# Patient Record
Sex: Male | Born: 1985 | Race: White | Hispanic: No | Smoking: Current every day smoker
Health system: Southern US, Community
[De-identification: ages and names within clinical notes are randomized; demographics above are authoritative.]

## PROBLEM LIST (undated history)

## (undated) DIAGNOSIS — R519 Headache, unspecified: Secondary | ICD-10-CM

## (undated) DIAGNOSIS — R51 Headache: Secondary | ICD-10-CM

---

## 2015-06-18 ENCOUNTER — Emergency Department (HOSPITAL_COMMUNITY): Payer: Self-pay

## 2015-06-18 ENCOUNTER — Encounter (HOSPITAL_COMMUNITY): Payer: Self-pay | Admitting: Emergency Medicine

## 2015-06-18 ENCOUNTER — Emergency Department (HOSPITAL_COMMUNITY)
Admission: EM | Admit: 2015-06-18 | Discharge: 2015-06-18 | Disposition: A | Discharge status: Home / Self Care | Payer: Self-pay | Attending: Emergency Medicine | Admitting: Emergency Medicine

## 2015-06-18 DIAGNOSIS — S93602A Unspecified sprain of left foot, initial encounter: Secondary | ICD-10-CM

## 2015-06-18 DIAGNOSIS — Y9289 Other specified places as the place of occurrence of the external cause: Secondary | ICD-10-CM | POA: Insufficient documentation

## 2015-06-18 DIAGNOSIS — S60221A Contusion of right hand, initial encounter: Secondary | ICD-10-CM | POA: Insufficient documentation

## 2015-06-18 DIAGNOSIS — Y9367 Activity, basketball: Secondary | ICD-10-CM | POA: Insufficient documentation

## 2015-06-18 DIAGNOSIS — S62394A Other fracture of fourth metacarpal bone, right hand, initial encounter for closed fracture: Secondary | ICD-10-CM | POA: Insufficient documentation

## 2015-06-18 DIAGNOSIS — S62309A Unspecified fracture of unspecified metacarpal bone, initial encounter for closed fracture: Secondary | ICD-10-CM

## 2015-06-18 DIAGNOSIS — Y998 Other external cause status: Secondary | ICD-10-CM | POA: Insufficient documentation

## 2015-06-18 DIAGNOSIS — W1839XA Other fall on same level, initial encounter: Secondary | ICD-10-CM | POA: Insufficient documentation

## 2015-06-18 MED ORDER — HYDROCODONE-ACETAMINOPHEN 5-325 MG PO TABS: 2.0000 | ORAL_TABLET | Freq: Four times a day (QID) | ORAL | Status: AC | PRN

## 2015-06-18 MED ORDER — IBUPROFEN 800 MG PO TABS: 800.0000 mg | ORAL_TABLET | Freq: Three times a day (TID) | ORAL | Status: AC

## 2015-06-18 MED ORDER — IBUPROFEN 800 MG PO TABS
800.0000 mg | ORAL_TABLET | Freq: Once | ORAL | Status: AC
  Administered 2015-06-18: 800 mg via ORAL
  Filled 2015-06-18: qty 1

## 2015-06-18 NOTE — ED Provider Notes (Signed)
History  By signing my name below, I, Karle Plumber, attest that this documentation has been prepared under the direction and in the presence of Fayrene Helper, PA-C. Electronically Signed: Karle Plumber, ED Scribe. 06/18/2015. 5:27 PM.  Chief Complaint  Patient presents with  . Ankle Pain  . Wrist Pain   The history is provided by the patient and medical records. No language interpreter was used.    HPI Comments:  Michael Black is a 30 y.o. male who presents to the Emergency Department complaining of a fall that occurred three days ago while playing basketball. Pt reports twisting the left ankle and injuring his right hand upon impact. He states he stepped on another player's foot, causing him to invert his left ankle. Pt reports associated swelling and bruising of the areas. This called his to fall on to the lateral right hand. He rates the pain at 9/10. Moving the areas increase the pain. Ice seems to help the hand pain but not the ankle pain. He has not taken anything for pain. He denies head trauma, LOC, fever, chills, nausea, vomiting, wounds, redness, numbness, tingling or weakness of the left ankle or foot or right hand. He is right hand dominant. 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 He denies allergies to any medications.  History reviewed. No pertinent past medical history. History reviewed. No pertinent past surgical history. History reviewed. No pertinent family history. Social History  Substance Use Topics  . Smoking status: None  . Smokeless tobacco: None  . Alcohol Use: None    Review of Systems  Constitutional: Negative for fever and chills.  Gastrointestinal: Negative for nausea and vomiting.  Musculoskeletal: Positive for joint swelling and arthralgias.  Skin: Positive for color change. Negative for wound.  Neurological: Negative for weakness and numbness.    Allergies  Review of patient's allergies indicates no known allergies.  Home Medications   Prior to Admission  medications   Not on File   Triage Vitals: BP 125/79 mmHg  Pulse 87  Temp(Src) 97.9 F (36.6 C) (Oral)  SpO2 96% Physical Exam  Constitutional: He is oriented to person, place, and time. He appears well-developed and well-nourished.  HENT:  Head: Normocephalic and atraumatic.  Eyes: EOM are normal.  Neck: Normal range of motion.  Cardiovascular: Normal rate.   Right arm radial pulse 2+ Cap refill less than 2 seconds of digits of right hand and left foot  Pulmonary/Chest: Effort normal.  Musculoskeletal: Normal range of motion. He exhibits tenderness.  No tenderness to palpation of right elbow. No tenderness to palpation of 4th and 5th metacarpal. Mild decreased ROM of 4th and 5th digits. Swelling to lateral dorsal aspect of right hand. No obvious deformity of right hand or left ankle or foot. Mild ecchymosis to hypothenar eminence. Left ankle with lateral malleolus, navicular bone and 5th MTP with tenderness to palpation.  Neurological: He is alert and oriented to person, place, and time.  Skin: Skin is warm and dry.  Psychiatric: He has a normal mood and affect. His behavior is normal.  Nursing note and vitals reviewed.   ED Course  Procedures (including critical care time) DIAGNOSTIC STUDIES: Oxygen Saturation is 96% on RA, adequate by my interpretation.   COORDINATION OF CARE: 5:23 PM- Will order crutches and splint right hand and left foot. Will refer to hand specialist. Will order Ibuprofen prior to discharge. Pt verbalizes understanding and agrees to plan.  Medications - No data to display  Labs Review Labs Reviewed - No data  to display  Imaging Review Dg Hand Complete Right  06/18/2015  CLINICAL DATA:  Larey SeatFell playing basketball 3 days ago, posterior RIGHT hand pain, initial encounter EXAM: RIGHT HAND - COMPLETE 3+ VIEW COMPARISON:  None FINDINGS: Osseous mineralization normal. Joint spaces preserved. Dorsal soft tissue swelling overlying the metacarpals with  additional soft tissue swelling along the ulnar border of the hand the level of the proximal fifth metacarpal. Nondisplaced oblique fracture base of fourth metacarpal. Tiny bony densities are seen dorsal to the [facility] on lateral view suspicious for fracture though uncertain if arising from the base of the fifth metacarpal or the adjacent hamate. No additional fracture, dislocation, or bone destruction. IMPRESSION: Nondisplaced oblique fracture at base of fourth metacarpal. Questionable fracture fragments at the dorsal margin of the fifth CMC joint, uncertain if related to the fifth metacarpal base or the adjacent hamate. Electronically Signed   By: Ulyses SouthwardMark  Boles M.D.   On: 06/18/2015 17:02   Dg Foot Complete Left  06/18/2015  CLINICAL DATA:  Larey SeatFell playing basketball 3 days ago, dorsal LEFT foot pain, initial encounter EXAM: LEFT FOOT - COMPLETE 3+ VIEW COMPARISON:  None FINDINGS: Osseous mineralization normal. Joint spaces preserved. Deformity at the head of the proximal phalanx fifth toe without definite additional fracture plane identified consistent with age-indeterminate fracture though favor this being old. No additional fracture, dislocation or bone destruction. IMPRESSION: Deformity at head of proximal phalanx fifth toe compatible with age-indeterminate fracture, favor old; recommend correlation for pain/ tenderness at this site to exclude acute injury. No additional fracture or dislocation identified. Electronically Signed   By: Ulyses SouthwardMark  Boles M.D.   On: 06/18/2015 17:04   I have personally reviewed and evaluated these images and lab results as part of my medical decision-making.   EKG Interpretation None      MDM   Final diagnoses:  Foot sprain, left, initial encounter  Closed fracture of metacarpal of right hand, initial encounter    BP 125/79 mmHg  Pulse 87  Temp(Src) 97.9 F (36.6 C) (Oral)  SpO2 96%   I personally performed the services described in this documentation, which was  scribed in my presence. The recorded information has been reviewed and is accurate.     5:40 PM Patient in with a mechanical injury when he injured his left foot and right hand. X-ray of the right hand demonstrate a nondisplaced oblique fracture at the base of the fourth metacarpal with a questionable fracture fragment at the dorsal margin of the fifth CMC joint. His pain is more appropriately presents at the fourth metacarpal. An order wrist splint was placed for protection and he will follow-up closely with enhancement specialist for further management. X-ray of his left foot demonstrate no acute fractures or dislocation. There is a deformity of the head of the proximal phalanx fifth toe compatible with age indeterminate fracture. He does not have any significant pain in that affected region on exam. No significant tenderness to his left ankle and left knee. An ASO will be applied. Crutch provided and outpatient follow-up given. Rice therapy discussed.  Fayrene HelperBowie Ayoub Arey, PA-C 06/18/15 [id number]  Lyndal Pulleyaniel Knott, MD 06/19/15 [phone]

## 2015-06-18 NOTE — ED Notes (Signed)
Twisted left ankle and popped right wrist in fall while playing basketball 3 days prior. Mild swelling to all areas

## 2015-06-18 NOTE — Discharge Instructions (Signed)
You have suffered a broken right hand and a sprain left foot.  Please follow up with hand specialist in 1 week for further management of your condition.  Take pain medication as prescribed.    Foot Sprain A foot sprain is an injury to one of the strong bands of tissue (ligaments) that connect and support the many bones in your feet. The ligament can be stretched too much or it can tear. A tear can be either partial or complete. The severity of the sprain depends on how much of the ligament was damaged or torn. CAUSES A foot sprain is usually caused by suddenly twisting or pivoting your foot. RISK FACTORS This injury is more likely to occur in people who:  Play a sport, such as basketball or football.  Exercise or play a sport without warming up.  Start a new workout or sport.  Suddenly increase how long or hard they exercise or play a sport. SYMPTOMS Symptoms of this condition start soon after an injury and include:  Pain, especially in the arch of the foot.  Bruising.  Swelling.  Inability to walk or use the foot to support body weight. DIAGNOSIS This condition is diagnosed with a medical history and physical exam. You may also have imaging tests, such as:  X-rays to make sure there are no broken bones (fractures).  MRI to see if the ligament has torn. TREATMENT Treatment varies depending on the severity of your sprain. Mild sprains can be treated with rest, ice, compression, and elevation (RICE). If your ligament is overstretched or partially torn, treatment usually involves keeping your foot in a fixed position (immobilization) for a period of time. To help you do this, your health care provider will apply a bandage, splint, or walking boot to keep your foot from moving until it heals. You may also be advised to use crutches or a scooter for a few weeks to avoid bearing weight on your foot while it is healing. If your ligament is fully torn, you may need surgery to reconnect the  ligament to the bone. After surgery, a cast or splint will be applied and will need to stay on your foot while it heals. Your health care provider may also suggest exercises or physical therapy to strengthen your foot. HOME CARE INSTRUCTIONS If You Have a Bandage, Splint, or Walking Boot:  Wear it as directed by your health care provider. Remove it only as directed by your health care provider.  Loosen the bandage, splint, or walking boot if your toes become numb and tingle, or if they turn cold and blue. Bathing  If your health care provider approves bathing and showering, cover the bandage or splint with a watertight plastic bag to protect it from water. Do not let the bandage or splint get wet. Managing Pain, Stiffness, and Swelling   If directed, apply ice to the injured area:  Put ice in a plastic bag.  Place a towel between your skin and the bag.  Leave the ice on for 20 minutes, 2-3 times per day.  Move your toes often to avoid stiffness and to lessen swelling.  Raise (elevate) the injured area above the level of your heart while you are sitting or lying down. Driving  Do not drive or operate heavy machinery while taking pain medicine.  Do not drive while wearing a bandage, splint, or walking boot on a foot that you use for driving. Activity  Rest as directed by your health care provider.  Do  not use the injured foot to support your body weight until your health care provider says that you can. Use crutches or other supportive devices as directed by your health care provider.  Ask your health care provider what activities are safe for you. Gradually increase how much and how far you walk until your health care provider says it is safe to return to full activity.  Do any exercise or physical therapy as directed by your health care provider. General Instructions  If a splint was applied, do not put pressure on any part of it until it is fully hardened. This may take  several hours.  Take medicines only as directed by your health care provider. These include over-the-counter medicines and prescription medicines.  Keep all follow-up visits as directed by your health care provider. This is important.  When you can walk without pain, wear supportive shoes that have stiff soles. Do not wear flip-flops, and do not walk barefoot. SEEK MEDICAL CARE IF:  Your pain is not controlled with medicine.  Your bruising or swelling gets worse or does not get better with treatment.  Your splint or walking boot is damaged. SEEK IMMEDIATE MEDICAL CARE IF:  Your foot is numb or blue.  Your foot feels colder than normal.   This information is not intended to replace advice given to you by your health care provider. Make sure you discuss any questions you have with your health care provider.   Document Released: 08/06/2001 Document Revised: 07/01/2014 Document Reviewed: 12/18/2013 Elsevier Interactive Patient Education 2016 [employer]  Metacarpal Fracture A metacarpal fracture is a break (fracture) of a bone in the hand. Metacarpals are the bones that extend from your knuckles to your wrist. In each hand, you have five metacarpal bones that connect your fingers and your thumb to your wrist. Some hand fractures have bone pieces that are close together and stable (simple). These fractures may be treated with only a splint or cast. Hand fractures that have many pieces of broken bone (comminuted), unstable bone pieces (displaced), or a bone that breaks through the skin (compound) usually require surgery. CAUSES This injury may be caused by:  A fall.  A hard, direct hit to your hand.  An injury that squeezes your knuckle, stretches your finger out of place, or crushes your hand. RISK FACTORS This injury is more likely to occur if:  You play contact sports.  You have certain bone diseases. SYMPTOMS  Symptoms of this type of fracture develop soon after the  injury. Symptoms may include:  Swelling.  Pain.  Stiffness.  Increased pain with movement.  Bruising.  Inability to move a finger.  A shortened finger.  A finger knuckle that looks sunken in.  Unusual appearance of the hand or finger (deformity). DIAGNOSIS  This injury may be diagnosed based on your signs and symptoms, especially if you had a recent hand injury. Your health care provider will perform a physical exam. He or she may also order X-rays to confirm the diagnosis.  TREATMENT  Treatment for this injury depends on the type of fracture you have and how severe it is. Possible treatments include:  Non-reduction. This can be done if the bone does not need to be moved back into place. The fracture can be casted or splinted as it is.   Closed reduction. If your bone is stable and can be moved back into place, you may only need to wear a cast or splint or have buddy taping.  Closed  reduction with internal fixation (CRIF). This is the most common treatment. You may have this procedure if your bone can be moved back into place but needs more support. Wires, pins, or screws may be inserted through your skin to stabilize the fracture.  Open reduction with internal fixation (ORIF). This may be needed if your fracture is severe and unstable. It involves surgery to move your bone back into the right position. Screws, wires, or plates are used to stabilize the fracture. After all procedures, you may need to wear a cast or a splint for several weeks. You will also need to have follow-up X-rays to make sure that the bone is healing well and staying in position. After you no longer need your cast or splint, you may need physical therapy. This will help you to regain full movement and strength in your hand.  HOME CARE INSTRUCTIONS  If You Have a Cast:  Do not stick anything inside the cast to scratch your skin. Doing that increases your risk of infection.  Check the skin around the cast  every day. Report any concerns to your health care provider. You may put lotion on dry skin around the edges of the cast. Do not apply lotion to the skin underneath the cast. If You Have a Splint:  Wear it as directed by your health care provider. Remove it only as directed by your health care provider.  Loosen the splint if your fingers become numb and tingle, or if they turn cold and blue. Bathing  Cover the cast or splint with a watertight plastic bag to protect it from water while you take a bath or a shower. Do not let the cast or splint get wet. Managing Pain, Stiffness, and Swelling  If directed, apply ice to the injured area (if you have a splint, not a cast):  Put ice in a plastic bag.  Place a towel between your skin and the bag.  Leave the ice on for 20 minutes, 2-3 times a day.  Move your fingers often to avoid stiffness and to lessen swelling.  Raise the injured area above the level of your heart while you are sitting or lying down. Driving  Do not drive or operate heavy machinery while taking pain medicine.  Do not drive while wearing a cast or splint on a hand that you use for driving. Activity  Return to your normal activities as directed by your health care provider. Ask your health care provider what activities are safe for you. General Instructions  Do not put pressure on any part of the cast or splint until it is fully hardened. This may take several hours.  Keep the cast or splint clean and dry.  Do not use any tobacco products, including cigarettes, chewing tobacco, or electronic cigarettes. Tobacco can delay bone healing. If you need help quitting, ask your health care provider.  Take medicines only as directed by your health care provider.  Keep all follow-up visits as directed by your health care provider. This is important. SEEK MEDICAL CARE IF:   Your pain is getting worse.  You have redness, swelling, or pain in the injured area.   You have  fluid, blood, or pus coming from under your cast or splint.   You notice a bad smell coming from under your cast or splint.   You have a fever.  SEEK IMMEDIATE MEDICAL CARE IF:   You develop a rash.   You have trouble breathing.   Your skin  or nails on your injured hand turn blue or gray even after you loosen your splint.  Your injured hand feels cold or becomes numb even after you loosen your splint.   You develop severe pain under the cast or in your hand.   This information is not intended to replace advice given to you by your health care provider. Make sure you discuss any questions you have with your health care provider.   Document Released: 02/14/2005 Document Revised: 11/05/2014 Document Reviewed: 12/04/2013 Elsevier Interactive Patient Education [employer]

## 2016-04-27 ENCOUNTER — Encounter (HOSPITAL_COMMUNITY): Payer: Self-pay | Admitting: *Deleted

## 2016-04-27 ENCOUNTER — Emergency Department (HOSPITAL_COMMUNITY)
Admission: EM | Admit: 2016-04-27 | Discharge: 2016-04-27 | Discharge status: Against Medical Advice | Payer: Self-pay | Attending: Emergency Medicine | Admitting: Emergency Medicine

## 2016-04-27 ENCOUNTER — Emergency Department (HOSPITAL_COMMUNITY): Payer: Self-pay

## 2016-04-27 DIAGNOSIS — S62366A Nondisplaced fracture of neck of fifth metacarpal bone, right hand, initial encounter for closed fracture: Secondary | ICD-10-CM | POA: Insufficient documentation

## 2016-04-27 DIAGNOSIS — S62346A Nondisplaced fracture of base of fifth metacarpal bone, right hand, initial encounter for closed fracture: Secondary | ICD-10-CM

## 2016-04-27 DIAGNOSIS — Y9389 Activity, other specified: Secondary | ICD-10-CM | POA: Insufficient documentation

## 2016-04-27 DIAGNOSIS — Y929 Unspecified place or not applicable: Secondary | ICD-10-CM | POA: Insufficient documentation

## 2016-04-27 DIAGNOSIS — Y999 Unspecified external cause status: Secondary | ICD-10-CM | POA: Insufficient documentation

## 2016-04-27 DIAGNOSIS — W2201XA Walked into wall, initial encounter: Secondary | ICD-10-CM | POA: Insufficient documentation

## 2016-04-27 DIAGNOSIS — F17210 Nicotine dependence, cigarettes, uncomplicated: Secondary | ICD-10-CM | POA: Insufficient documentation
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
## 2016-04-27 NOTE — ED Provider Notes (Signed)
[facility]PT Provider Note   CSN: [id number] Arrival date & time: 04/27/16  1659   By signing my name below, I, Teofilo Pod, attest that this documentation has been prepared under the direction and in the presence of Mathews Robinsons, [place]. Electronically Signed: Teofilo Pod, ED Scribe. 04/27/2016. 7:18 PM.   History   Chief Complaint Chief Complaint  Patient presents with  . Hand Injury    The history is provided by the patient. No language interpreter was used.   HPI Comments:  Michael Black is a 31 y.o. male who presents to the Emergency Department complaining of constant right hand pain/swelling following an injury 9 days ago. Pt states that when he was intoxicated 9 days ago he punched a wall. He reports constant pain and swelling since the injury. Pt states that he was having difficulty using his right hand at work, and notes that he was sent here by his employer for legal purposes. He has used ice with no relief for pain/swelling. Pt denies numbness/tingling in his fingers.  History reviewed. No pertinent past medical history.  There are no active problems to display for this patient.   History reviewed. No pertinent surgical history.     Home Medications    Prior to Admission medications   Medication Sig Start Date End Date Taking? Authorizing Provider  HYDROcodone-acetaminophen (NORCO/VICODIN) 5-325 MG tablet Take 2 tablets by mouth every 6 (six) hours as needed for severe pain. 06/18/15   Fayrene Helper, PA-C  ibuprofen (ADVIL,MOTRIN) 800 MG tablet Take 1 tablet (800 mg total) by mouth 3 (three) times daily. 06/18/15   Fayrene Helper, PA-C    Family History No family history on file.  Social History Social History  Substance Use Topics  . Smoking status: Current Every Day Smoker    Packs/day: 0.50    Types: Cigarettes  . Smokeless tobacco: Never Used  . Alcohol use Yes     Comment: fifth whisky every other day     Allergies   Patient has no  known allergies.   Review of Systems Review of Systems  Constitutional: Negative for chills.  HENT: Negative for ear pain and sore throat.   Eyes: Negative for pain and visual disturbance.  Respiratory: Negative for cough and shortness of breath.   Cardiovascular: Negative for chest pain and palpitations.  Gastrointestinal: Negative for abdominal pain and vomiting.  Genitourinary: Negative for dysuria and hematuria.  Musculoskeletal: Positive for arthralgias and joint swelling. Negative for back pain.  Skin: Negative for color change and rash.  Neurological: Negative for seizures, syncope and numbness.     Physical Exam Updated Vital Signs BP 118/70 (BP Location: Left Arm)   Pulse 99   Temp 98.1 F (36.7 C) (Oral)   Resp 18   Ht 5\' 8"  (1.727 m)   Wt 86.2 kg   SpO2 100%   BMI 28.89 kg/m   Physical Exam  Constitutional: He appears well-developed and well-nourished. No distress.  Patient is afebrile, non-toxic appearing, seating comfortably in chair in no acute distress.  HENT:  Head: Normocephalic and atraumatic.  Eyes: Conjunctivae are normal.  Cardiovascular: Normal rate.   Pulmonary/Chest: Effort normal.  Abdominal: He exhibits no distension.  Musculoskeletal:  Full ROM of right fingers. Tender over the proximal 5th metacarpal.  Neurological: He is alert.  Skin: Skin is warm and dry. Capillary refill takes less than 2 seconds.  Psychiatric: He has a normal mood and affect.  Nursing note and vitals reviewed.  ED Treatments / Results  DIAGNOSTIC STUDIES:  Oxygen Saturation is 97% on RA, normal by my interpretation.    COORDINATION OF CARE:  7:17 PM Pt was offered a new immobilizer but he refused. Pt states that he only needs documentation that he was seen.   Labs (all labs ordered are listed, but only abnormal results are displayed) Labs Reviewed - No data to display  EKG  EKG Interpretation None       Radiology Dg Hand Complete Right  Result  Date: 04/27/2016 CLINICAL DATA:  31 y/o M; hand injury with pain greatest of fifth metacarpal EXAM: RIGHT HAND - COMPLETE 3+ VIEW COMPARISON:  None. FINDINGS: Lucency of base of fifth metacarpal medially, question nondisplaced fracture. No other fracture or dislocation identified. IMPRESSION: Lucency of base of fifth metacarpal medially, question nondisplaced fracture, correlate for focal tenderness. No other fracture or dislocation identified. Electronically Signed   By: Mitzi HansenLance  Furusawa-Stratton M.D.   On: 04/27/2016 17:32    Procedures Procedures (including critical care time)  Medications Ordered in ED Medications - No data to display   Initial Impression / Assessment and Plan / ED Course  I have reviewed the triage vital signs and the nursing notes.  Pertinent labs & imaging results that were available during my care of the patient were reviewed by me and considered in my medical decision making (see chart for details).  Discussed strict return precautions. Patient was advised to return to the emergency department if experiencing any new or worsening symptoms. Patient clearly understood instructions and agreed with discharge plan.     During otherwise healthy 31 year old male presenting 90s after punching a wall with his right hand. He has had tenderness and swelling over the fifth metacarpal. On exam there is mild swelling and tenderness over the proximal fifth metacarpal. X-ray with questionable nondisplaced fracture of fifth metacarpal.  Advised patient that he needed to have his hand immobilized and when letting them know that I would order a splint, they stated that he already had one from a prior boxer's fracture injury and he did not have the time to wait to have a splint put on. Patient and girlfriend state that they need an note to say that they were here so he can go back to work but refuses any treatment. Patient left AMA Discussed the risk of leaving without immobilizing the  injury and patient stated that he will use to when he has at home.  We discussed the nature and purpose, risks and benefits, as well as, the alternatives of treatment. Time was given to allow the opportunity to ask questions and consider their options, and after the discussion, the patient decided to refuse the offerred treatment. The patient was informed that refusal could lead to, but was not limited to, permanent disability, or severe pain. If present, I asked the relatives or significant others to dissuade them without success.  Prior to refusing, I determined that the patient had the capacity to make their decision and understood the consequences of that decision. After refusal, I made every reasonable opportunity to treat them to the best of my ability.  The patient was notified that they may return to the emergency department at any time for further treatment.     Final Clinical Impressions(s) / ED Diagnoses   Final diagnoses:  Closed nondisplaced fracture of base of fifth metacarpal bone of right hand, initial encounter    New Prescriptions Discharge Medication List as of 04/27/2016  7:28 PM  I personally performed the services described in this documentation, which was scribed in my presence. The recorded information has been reviewed and is accurate.       Georgiana Shore, PA-C 04/27/16 1951    Gerhard Munch, MD 04/27/16 2041

## 2016-09-22 ENCOUNTER — Encounter (HOSPITAL_COMMUNITY): Payer: Self-pay | Admitting: Emergency Medicine

## 2016-09-22 ENCOUNTER — Ambulatory Visit (HOSPITAL_COMMUNITY)
Admission: EM | Admit: 2016-09-22 | Discharge: 2016-09-23 | Disposition: A | Discharge status: Home / Self Care | Payer: Self-pay | Attending: Emergency Medicine | Admitting: Emergency Medicine

## 2016-09-22 DIAGNOSIS — L02512 Cutaneous abscess of left hand: Secondary | ICD-10-CM

## 2016-09-22 DIAGNOSIS — F17210 Nicotine dependence, cigarettes, uncomplicated: Secondary | ICD-10-CM | POA: Insufficient documentation

## 2016-09-22 DIAGNOSIS — R197 Diarrhea, unspecified: Secondary | ICD-10-CM | POA: Insufficient documentation

## 2016-09-22 DIAGNOSIS — R51 Headache: Secondary | ICD-10-CM | POA: Insufficient documentation

## 2016-09-22 DIAGNOSIS — T1490XA Injury, unspecified, initial encounter: Secondary | ICD-10-CM

## 2016-09-22 HISTORY — DX: Headache: R51

## 2016-09-22 HISTORY — DX: Headache, unspecified: R51.9

## 2016-09-22 MED ORDER — OXYCODONE-ACETAMINOPHEN 5-325 MG PO TABS
1.0000 | ORAL_TABLET | ORAL | Status: DC | PRN
  Administered 2016-09-22: 1 via ORAL

## 2016-09-22 MED ORDER — OXYCODONE-ACETAMINOPHEN 5-325 MG PO TABS
ORAL_TABLET | ORAL | Status: AC
  Filled 2016-09-22: qty 1

## 2016-09-22 NOTE — ED Triage Notes (Signed)
Pt presents with wound to R ring finger for 5 days; area is red, swollen and warm to touch; now spreading to knuckles and hand; pt reports very painful; did drain a coffee colored ooze and then white colored; pt also reports some cold chills

## 2016-09-22 NOTE — ED Provider Notes (Signed)
[facility] Provider Note   CSN: [id number] Arrival date & time: 09/22/16  1924     History   Chief Complaint Chief Complaint  Patient presents with  . Abscess    HPI Michael Black is a 31 y.o. male.  The history is provided by the patient. No language interpreter was used.  Abscess  Location:  Hand Hand abscess location:  L fingers Size:  2 Abscess quality: painful and redness   Red streaking: no   Duration:  2 days Progression:  Worsening Pain details:    Quality:  Aching and pressure   Severity:  Moderate   Duration:  2 days   Timing:  Constant   Progression:  Worsening Chronicity:  New Context: not diabetes   Relieved by:  Nothing Worsened by:  Nothing Ineffective treatments:  None tried Associated symptoms: no fever   Pt complains of pain to left ring finger.  Pt thinks something bit him.  Pt complains of swelling to finger.  Pt reports now swelling to hand.  Pt can not move finger.    Past Medical History:  Diagnosis Date  . Headache     There are no active problems to display for this patient.   No past surgical history on file.     Home Medications    Prior to Admission medications   Medication Sig Start Date End Date Taking? Authorizing Provider  HYDROcodone-acetaminophen (NORCO/VICODIN) 5-325 MG tablet Take 2 tablets by mouth every 6 (six) hours as needed for severe pain. 06/18/15   Fayrene Helperran, [place], PA-C  ibuprofen (ADVIL,MOTRIN) 800 MG tablet Take 1 tablet (800 mg total) by mouth 3 (three) times daily. 06/18/15   Fayrene Helperran, [place], PA-C    Family History No family history on file.  Social History Social History  Substance Use Topics  . Smoking status: Current Every Day Smoker    Packs/day: 0.50    Types: Cigarettes  . Smokeless tobacco: Never Used  . Alcohol use Yes     Comment: fifth whisky every other day     Allergies   Patient has no known allergies.   Review of Systems Review of Systems  Constitutional: Negative for fever.    All other systems reviewed and are negative.    Physical Exam Updated Vital Signs BP (!) 124/95   Pulse 86   Temp 97.9 F (36.6 C) (Oral)   Resp (!) 22   Ht 5\' 8"  (1.727 m)   Wt 88.5 kg (195 lb)   SpO2 98%   BMI 29.65 kg/m   Physical Exam  Constitutional: He appears well-developed and well-nourished.  HENT:  Head: Normocephalic.  Musculoskeletal: He exhibits tenderness.  Swollen left ring finger. Decreased range of motion  Neurological: He is alert.  Skin: Skin is warm.  Psychiatric: He has a normal mood and affect.  Nursing note and vitals reviewed.    ED Treatments / Results  Labs (all labs ordered are listed, but only abnormal results are displayed) Labs Reviewed - No data to display  EKG  EKG Interpretation None       Radiology No results found.  Procedures Procedures (including critical care time)  Medications Ordered in ED Medications  oxyCODONE-acetaminophen (PERCOCET/ROXICET) 5-325 MG per tablet 1 tablet (1 tablet Oral Given 09/22/16 1939)  oxyCODONE-acetaminophen (PERCOCET/ROXICET) 5-325 MG per tablet (not administered)     Initial Impression / Assessment and Plan / ED Course  I have reviewed the triage vital signs and the nursing notes.  Pertinent labs & imaging  results that were available during my care of the patient were reviewed by me and considered in my medical decision making (see chart for details).     Tetanus 1 year ago.  Final Clinical Impressions(s) / ED Diagnoses   Final diagnoses:  Injury  Abscess of left ring finger    New Prescriptions New Prescriptions   No medications on file   I spoke to Dr. Merlyn Lotkuzma who will see pt here.  Labs and xray ordered.  Pt requested to stay npo   Osie CheeksSofia, Leslie K, PA-C 09/22/16 2310    Rolland PorterJames, Mark, MD 10/06/16 [phone]

## 2016-09-23 ENCOUNTER — Emergency Department (HOSPITAL_COMMUNITY): Payer: Self-pay | Admitting: Anesthesiology

## 2016-09-23 ENCOUNTER — Encounter (HOSPITAL_COMMUNITY)
Admission: EM | Disposition: A | Discharge status: Home / Self Care | Payer: Self-pay | Source: Home / Self Care | Attending: Emergency Medicine

## 2016-09-23 ENCOUNTER — Emergency Department (HOSPITAL_COMMUNITY): Payer: Self-pay

## 2016-09-23 ENCOUNTER — Encounter (HOSPITAL_COMMUNITY): Payer: Self-pay | Admitting: Anesthesiology

## 2016-09-23 HISTORY — PX: INCISION AND DRAINAGE ABSCESS: SHX5864

## 2016-09-23 LAB — CBC WITH DIFFERENTIAL/PLATELET
Basophils Absolute: 0 10*3/uL (ref 0.0–0.1)
Basophils Relative: 0 %
EOS PCT: 2 %
Eosinophils Absolute: 0.2 10*3/uL (ref 0.0–0.7)
HCT: 46.3 % (ref 39.0–52.0)
HEMOGLOBIN: 15.7 g/dL (ref 13.0–17.0)
LYMPHS ABS: 2.9 10*3/uL (ref 0.7–4.0)
LYMPHS PCT: 35 %
MCH: 31.8 pg (ref 26.0–34.0)
MCHC: 33.9 g/dL (ref 30.0–36.0)
MCV: 93.7 fL (ref 78.0–100.0)
Monocytes Absolute: 0.6 10*3/uL (ref 0.1–1.0)
Monocytes Relative: 7 %
NEUTROS PCT: 56 %
Neutro Abs: 4.7 10*3/uL (ref 1.7–7.7)
Platelets: 285 10*3/uL (ref 150–400)
RBC: 4.94 MIL/uL (ref 4.22–5.81)
RDW: 12.9 % (ref 11.5–15.5)
WBC: 8.5 10*3/uL (ref 4.0–10.5)

## 2016-09-23 LAB — BASIC METABOLIC PANEL
Anion gap: 9 (ref 5–15)
BUN: 9 mg/dL (ref 6–20)
CHLORIDE: 100 mmol/L — AB (ref 101–111)
CO2: 27 mmol/L (ref 22–32)
Calcium: 9.8 mg/dL (ref 8.9–10.3)
Creatinine, Ser: 0.88 mg/dL (ref 0.61–1.24)
GFR calc Af Amer: >60 mL/min (ref >60)
GFR calc non Af Amer: >60 mL/min (ref >60)
GLUCOSE: 87 mg/dL (ref 65–99)
POTASSIUM: 4.1 mmol/L (ref 3.5–5.1)
Sodium: 136 mmol/L (ref 135–145)

## 2016-09-23 SURGERY — INCISION AND DRAINAGE, ABSCESS
Anesthesia: General | Laterality: Right

## 2016-09-23 MED ORDER — OXYCODONE HCL 5 MG/5ML PO SOLN: 5.0000 mg | Freq: Once | ORAL | Status: DC | PRN

## 2016-09-23 MED ORDER — MIDAZOLAM HCL 2 MG/2ML IJ SOLN
INTRAMUSCULAR | Status: AC
  Filled 2016-09-23: qty 2

## 2016-09-23 MED ORDER — FENTANYL CITRATE (PF) 250 MCG/5ML IJ SOLN
INTRAMUSCULAR | Status: DC | PRN
  Administered 2016-09-23 (×2): 50 ug via INTRAVENOUS

## 2016-09-23 MED ORDER — HYDROMORPHONE HCL 1 MG/ML IJ SOLN
1.0000 mg | Freq: Once | INTRAMUSCULAR | Status: AC
  Administered 2016-09-23: 1 mg via INTRAVENOUS
  Filled 2016-09-23: qty 1

## 2016-09-23 MED ORDER — SULFAMETHOXAZOLE-TRIMETHOPRIM 800-160 MG PO TABS: 1.0000 | ORAL_TABLET | Freq: Two times a day (BID) | ORAL | 0 refills | Status: AC

## 2016-09-23 MED ORDER — LIDOCAINE HCL (CARDIAC) 20 MG/ML IV SOLN
INTRAVENOUS | Status: DC | PRN
  Administered 2016-09-23: 80 mg via INTRATRACHEAL

## 2016-09-23 MED ORDER — LACTATED RINGERS IV SOLN
INTRAVENOUS | Status: DC | PRN
  Administered 2016-09-23: 02:00:00 via INTRAVENOUS

## 2016-09-23 MED ORDER — VANCOMYCIN HCL 1000 MG IV SOLR
INTRAVENOUS | Status: DC | PRN
  Administered 2016-09-23: 1000 mg via INTRAVENOUS

## 2016-09-23 MED ORDER — BUPIVACAINE HCL (PF) 0.25 % IJ SOLN
INTRAMUSCULAR | Status: AC
  Filled 2016-09-23: qty 30

## 2016-09-23 MED ORDER — ONDANSETRON HCL 4 MG/2ML IJ SOLN
INTRAMUSCULAR | Status: DC | PRN
  Administered 2016-09-23: 4 mg via INTRAVENOUS

## 2016-09-23 MED ORDER — MIDAZOLAM HCL 5 MG/5ML IJ SOLN
INTRAMUSCULAR | Status: DC | PRN
  Administered 2016-09-23: 2 mg via INTRAVENOUS

## 2016-09-23 MED ORDER — OXYCODONE HCL 5 MG PO TABS: 5.0000 mg | ORAL_TABLET | Freq: Once | ORAL | Status: DC | PRN

## 2016-09-23 MED ORDER — HYDROCODONE-ACETAMINOPHEN 5-325 MG PO TABS: ORAL_TABLET | ORAL | 0 refills | Status: AC

## 2016-09-23 MED ORDER — HYDROMORPHONE HCL 1 MG/ML IJ SOLN: 0.2500 mg | INTRAMUSCULAR | Status: DC | PRN

## 2016-09-23 MED ORDER — VANCOMYCIN HCL IN DEXTROSE 1-5 GM/200ML-% IV SOLN
INTRAVENOUS | Status: AC
  Filled 2016-09-23: qty 200

## 2016-09-23 MED ORDER — FENTANYL CITRATE (PF) 250 MCG/5ML IJ SOLN
INTRAMUSCULAR | Status: AC
  Filled 2016-09-23: qty 5

## 2016-09-23 MED ORDER — BUPIVACAINE HCL (PF) 0.25 % IJ SOLN
INTRAMUSCULAR | Status: DC | PRN
  Administered 2016-09-23: 10 mL

## 2016-09-23 MED ORDER — PROPOFOL 10 MG/ML IV BOLUS
INTRAVENOUS | Status: DC | PRN
  Administered 2016-09-23: 200 mg via INTRAVENOUS

## 2016-09-23 SURGICAL SUPPLY — 47 items
BANDAGE ACE 3X5.8 VEL STRL LF (GAUZE/BANDAGES/DRESSINGS) ×3 IMPLANT
BANDAGE ACE 4X5 VEL STRL LF (GAUZE/BANDAGES/DRESSINGS) ×3 IMPLANT
BANDAGE COBAN STERILE 2 (GAUZE/BANDAGES/DRESSINGS) IMPLANT
BNDG COHESIVE 1X5 TAN STRL LF (GAUZE/BANDAGES/DRESSINGS) ×3 IMPLANT
BNDG CONFORM 2 STRL LF (GAUZE/BANDAGES/DRESSINGS) ×3 IMPLANT
BNDG ESMARK 4X9 LF (GAUZE/BANDAGES/DRESSINGS) IMPLANT
BNDG GAUZE ELAST 4 BULKY (GAUZE/BANDAGES/DRESSINGS) ×3 IMPLANT
CORDS BIPOLAR (ELECTRODE) ×3 IMPLANT
COVER SURGICAL LIGHT HANDLE (MISCELLANEOUS) ×3 IMPLANT
DECANTER SPIKE VIAL GLASS SM (MISCELLANEOUS) ×3 IMPLANT
DRAIN PENROSE 1/4X12 LTX STRL (WOUND CARE) IMPLANT
DRSG ADAPTIC 3X8 NADH LF (GAUZE/BANDAGES/DRESSINGS) IMPLANT
DRSG EMULSION OIL 3X3 NADH (GAUZE/BANDAGES/DRESSINGS) IMPLANT
DRSG PAD ABDOMINAL 8X10 ST (GAUZE/BANDAGES/DRESSINGS) ×6 IMPLANT
GAUZE PACKING IODOFORM 1/4X15 (GAUZE/BANDAGES/DRESSINGS) ×3 IMPLANT
GAUZE SPONGE 4X4 12PLY STRL (GAUZE/BANDAGES/DRESSINGS) ×3 IMPLANT
GAUZE XEROFORM 1X8 LF (GAUZE/BANDAGES/DRESSINGS) ×3 IMPLANT
GLOVE BIO SURGEON STRL SZ7.5 (GLOVE) ×3 IMPLANT
GLOVE BIOGEL PI IND STRL 8 (GLOVE) ×1 IMPLANT
GLOVE BIOGEL PI INDICATOR 8 (GLOVE) ×2
GOWN STRL REUS W/ TWL LRG LVL3 (GOWN DISPOSABLE) ×1 IMPLANT
GOWN STRL REUS W/TWL LRG LVL3 (GOWN DISPOSABLE) ×2
KIT BASIN OR (CUSTOM PROCEDURE TRAY) ×3 IMPLANT
KIT ROOM TURNOVER OR (KITS) ×3 IMPLANT
LOOP VESSEL MAXI BLUE (MISCELLANEOUS) IMPLANT
MANIFOLD NEPTUNE II (INSTRUMENTS) ×3 IMPLANT
NEEDLE HYPO 25X1 1.5 SAFETY (NEEDLE) IMPLANT
NS IRRIG 1000ML POUR BTL (IV SOLUTION) ×3 IMPLANT
PACK ORTHO EXTREMITY (CUSTOM PROCEDURE TRAY) ×3 IMPLANT
PAD ARMBOARD 7.5X6 YLW CONV (MISCELLANEOUS) ×6 IMPLANT
SCRUB BETADINE 4OZ XXX (MISCELLANEOUS) ×3 IMPLANT
SET CYSTO W/LG BORE CLAMP LF (SET/KITS/TRAYS/PACK) ×3 IMPLANT
SOL PREP POV-IOD 4OZ 10% (MISCELLANEOUS) ×3 IMPLANT
SPONGE LAP 4X18 X RAY DECT (DISPOSABLE) ×3 IMPLANT
SUT ETHILON 4 0 P 3 18 (SUTURE) IMPLANT
SUT ETHILON 4 0 PS 2 18 (SUTURE) ×3 IMPLANT
SUT MON AB 5-0 P3 18 (SUTURE) IMPLANT
SWAB CULTURE ESWAB REG 1ML (MISCELLANEOUS) IMPLANT
SYR CONTROL 10ML LL (SYRINGE) IMPLANT
TOWEL OR 17X24 6PK STRL BLUE (TOWEL DISPOSABLE) ×3 IMPLANT
TOWEL OR 17X26 10 PK STRL BLUE (TOWEL DISPOSABLE) ×3 IMPLANT
TUBE CONNECTING 12'X1/4 (SUCTIONS) ×1
TUBE CONNECTING 12X1/4 (SUCTIONS) ×2 IMPLANT
TUBE FEEDING ENTERAL 5FR 16IN (TUBING) IMPLANT
UNDERPAD 30X30 (UNDERPADS AND DIAPERS) ×3 IMPLANT
WATER STERILE IRR 1000ML POUR (IV SOLUTION) ×3 IMPLANT
YANKAUER SUCT BULB TIP NO VENT (SUCTIONS) ×3 IMPLANT

## 2016-09-23 NOTE — H&P (Signed)
Michael Black is an 31 y.o. male.   Chief Complaint: right ring finger abscess HPI: 31 yo rhd male states he has had worsening swelling, erythema, pain of right ring finger x 4 days.  Thinks he may have poked it while working on [employer].  Has had some chills, nausea, diarrhea.  Describes throbbing pain that can reach 8+/10 severity.  Alleviated by rest and positioning.  Aggravated by motion and palpation.  Note from 09/23/2016 by Caryl Ada, Simpson reviewed. Xrays viewed and interpreted by me: ap, lateral, oblique views of finger show no fracture, dislocation, radioopaque foreign body.  Soft tissue swelling. Labs reviewed: WBC 8.5  Allergies: No Known Allergies  Past Medical History:  Diagnosis Date  . Headache     No past surgical history on file.  Family History: No family history on file.  Social History:   reports that he has been smoking Cigarettes.  He has been smoking about 0.50 packs per day. He has never used smokeless tobacco. He reports that he drinks alcohol. He reports that he uses drugs, including Marijuana.  Medications:  (Not in a hospital admission)  Results for orders placed or performed during the hospital encounter of 09/22/16 (from the past 48 hour(s))  CBC with Differential/Platelet     Status: None   Collection Time: 09/22/16 11:58 PM  Result Value Ref Range   WBC 8.5 4.0 - 10.5 K/uL   RBC 4.94 4.22 - 5.81 MIL/uL   Hemoglobin 15.7 13.0 - 17.0 g/dL   HCT 46.3 39.0 - 52.0 %   MCV 93.7 78.0 - 100.0 fL   MCH 31.8 26.0 - 34.0 pg   MCHC 33.9 30.0 - 36.0 g/dL   RDW 12.9 11.5 - 15.5 %   Platelets 285 150 - 400 K/uL   Neutrophils Relative % 56 %   Neutro Abs 4.7 1.7 - 7.7 K/uL   Lymphocytes Relative 35 %   Lymphs Abs 2.9 0.7 - 4.0 K/uL   Monocytes Relative 7 %   Monocytes Absolute 0.6 0.1 - 1.0 K/uL   Eosinophils Relative 2 %   Eosinophils Absolute 0.2 0.0 - 0.7 K/uL   Basophils Relative 0 %   Basophils Absolute 0.0 0.0 - 0.1 K/uL  Basic metabolic  panel     Status: Abnormal   Collection Time: 09/22/16 11:58 PM  Result Value Ref Range   Sodium 136 135 - 145 mmol/L   Potassium 4.1 3.5 - 5.1 mmol/L   Chloride 100 (L) 101 - 111 mmol/L   CO2 27 22 - 32 mmol/L   Glucose, Bld 87 65 - 99 mg/dL   BUN 9 6 - 20 mg/dL   Creatinine, Ser 0.88 0.61 - 1.24 mg/dL   Calcium 9.8 8.9 - 10.3 mg/dL   GFR calc non Af Amer >60 >60 mL/min   GFR calc Af Amer >60 >60 mL/min    Comment: (NOTE) The eGFR has been calculated using the CKD EPI equation. This calculation has not been validated in all clinical situations. eGFR's persistently <60 mL/min signify possible Chronic Kidney Disease.    Anion gap 9 5 - 15    Dg Finger Ring Right  Result Date: 09/23/2016 CLINICAL DATA:  Fourth finger wound for 5 days, now with erythema and swelling spreading onto the hand. EXAM: RIGHT RING FINGER 2+V COMPARISON:  None. FINDINGS: Negative for fracture or dislocation. Marked soft tissue swelling at the dorsum of the fourth PIP. There also is soft tissue swelling at the dorsum of the hand.  No soft tissue gas. No radiopaque foreign body. IMPRESSION: Marked soft tissue swelling. No fracture, dislocation or radiopaque foreign body. Electronically Signed   By: Andreas Newport M.D.   On: 09/23/2016 01:05     A comprehensive review of systems was negative except for: Constitutional: positive for chills and sweats Gastrointestinal: positive for diarrhea Neurological: positive for headaches Review of Systems: No chest pain, shortness of breath, constipation, easy bleeding or bruising, dizziness, vision changes, fainting.   Blood pressure (!) 124/95, pulse 86, temperature 97.9 F (36.6 C), temperature source Oral, resp. rate (!) 22, height 5' 8" (1.727 m), weight 88.5 kg (195 lb), SpO2 98 %.  General appearance: alert, cooperative and appears stated age Head: Normocephalic, without obvious abnormality, atraumatic Neck: supple, symmetrical, trachea midline Resp: clear to  auscultation bilaterally Cardio: regular rate and rhythm GI: non-tender Extremities: Intact sensation and capillary refill all digits.  +epl/fpl/io.  Small puncture on dorsum of right ring finger.  Surrounding swelling and erythema.  Some swelling in dorsum of hand.  Tender in entire digit and dorsum of hand.  Most painful dorsally at pip joint.  Pain with motion of pip joint. Pulses: 2+ and symmetric Skin: Skin color, texture, turgor normal. No rashes or lesions Neurologic: Grossly normal Incision/Wound: As above  Assessment/Plan Right ring finger abscess possibly involving pip joint.  Do not think that this is a flexor sheath infection.   Non operative and operative treatment options were discussed with the patient and patient wishes to proceed with operative treatment.  Recommend OR for incision and drainage. Risks, benefits, and alternatives of surgery were discussed and the patient agrees with the plan of care.   [name] 09/23/2016, 1:52 AM

## 2016-09-23 NOTE — Anesthesia Preprocedure Evaluation (Addendum)
Anesthesia Evaluation  Patient identified by MRN, date of birth, ID band Patient awake    Reviewed: Allergy & Precautions, NPO status , Patient's Chart, lab work & pertinent test results  History of Anesthesia Complications Negative for: history of anesthetic complications  Airway Mallampati: I  TM Distance: >3 FB Neck ROM: Full    Dental  (+) Teeth Intact, Dental Advisory Given   Pulmonary Current Smoker,    breath sounds clear to auscultation- rhonchi       Cardiovascular negative cardio ROS   Rhythm:Regular     Neuro/Psych  Headaches, neg Seizures negative psych ROS   GI/Hepatic negative GI ROS, Neg liver ROS,   Endo/Other  negative endocrine ROS  Renal/GU negative Renal ROS     Musculoskeletal Right index finger infection   Abdominal   Peds  Hematology negative hematology ROS (+)   Anesthesia Other Findings Multiple body piercings, some removed but not all able to come out per patient, two tongue rings  Reproductive/Obstetrics                            Anesthesia Physical Anesthesia Plan  ASA: II and emergent  Anesthesia Plan: General   Post-op Pain Management:    Induction: Intravenous  PONV Risk Score and Plan: 1 and Ondansetron and Dexamethasone  Airway Management Planned: Oral ETT and LMA  Additional Equipment: None  Intra-op Plan:   Post-operative Plan: Extubation in OR  Informed Consent: I have reviewed the patients History and Physical, chart, labs and discussed the procedure including the risks, benefits and alternatives for the proposed anesthesia with the patient or authorized representative who has indicated his/her understanding and acceptance.   Dental advisory given  Plan Discussed with: CRNA, Surgeon and Anesthesiologist  Anesthesia Plan Comments:        Anesthesia Quick Evaluation

## 2016-09-23 NOTE — Discharge Instructions (Signed)

## 2016-09-23 NOTE — Anesthesia Procedure Notes (Signed)
Procedure Name: LMA Insertion Date/Time: 09/23/2016 2:32 AM Performed by: Brien MatesMAHONY, Heriberto Stmartin D Pre-anesthesia Checklist: Patient identified, Emergency Drugs available, Suction available, Patient being monitored and Timeout performed Patient Re-evaluated:Patient Re-evaluated prior to induction Oxygen Delivery Method: Circle system utilized Preoxygenation: Pre-oxygenation with 100% oxygen Induction Type: IV induction Ventilation: Mask ventilation without difficulty Number of attempts: 1 Placement Confirmation: breath sounds checked- equal and bilateral and positive ETCO2 Tube secured with: Tape Dental Injury: Teeth and Oropharynx as per pre-operative assessment

## 2016-09-23 NOTE — Op Note (Signed)
NAMKirt Boys:  Bornemann, Emmanuelle                ACCOUNT NO.:  [id number]  MEDICAL RECORD NO.:  [id number]  LOCATION:  [facility]                         FACILITY:  [facility]  PHYSICIAN:  Betha LoaKevin Rosemaria Inabinet, MD        DATE OF BIRTH:  [birth year]  DATE OF PROCEDURE:  09/23/2016 DATE OF DISCHARGE:                              OPERATIVE REPORT   PREOPERATIVE DIAGNOSIS:  Right ring finger abscess with possible proximal interphalangeal joint infection.  POSTOPERATIVE DIAGNOSIS:  Right ring finger abscess with possible proximal interphalangeal joint infection.  PROCEDURE:  Incision and drainage of right ring finger abscess including PIP joint.  SURGEON:  Betha LoaKevin Taysia Rivere, MD.  ASSISTANT:  None.  ANESTHESIA:  General.  IV FLUIDS:  Per anesthesia flow sheet.  ESTIMATED BLOOD LOSS:  Minimal.  COMPLICATIONS:  None.  SPECIMENS:  Cultures to Micro.  TOURNIQUET TIME:  18 minutes.  DISPOSITION:  Stable to PACU.  INDICATIONS:  Mr. Michael Black is a 31 year old right-hand dominant male, who has noted 4 days of increasing swelling, erythema, and pain of the right ring finger.  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  He has had some chills and sweats.  I recommended incision and drainage in the operating room.  Risks, benefits, and alternatives of surgery were discussed including the risk of blood loss; infection; damage to nerves, vessels, tendons, ligaments, bone; failure of surgery; need for additional surgery; complications with wound healing; continued pain; continued infection; need for repeat irrigation and debridement. We also discussed that there was concern that the infection could be into the PIP joint due to the location and the volar tenderness.  He voiced understanding of these risks and elected to proceed.  OPERATIVE COURSE:  After being identified preoperatively by myself, the patient and I agreed upon the procedure and site of procedure.  Surgical site was marked.  The risks, benefits,  and alternatives of the surgery were reviewed and wished to proceed.  Surgical consent had been signed. Antibiotics were held for cultures.  He was transferred to the operating room and placed on the operating room table in supine position with the right upper extremity on arm board.  General anesthesia induced by anesthesiologist.  Right upper extremity was prepped and draped in normal sterile orthopedic fashion.  Surgical pause was performed between surgeons, anesthesia, operating room staff; and all were in agreement as to the patient, procedure, and site of procedure.  Tourniquet at the proximal aspect of the extremities was inflated to 250 mmHg after exsanguination of the limb with an Esmarch bandage.  Incision was made on the dorsum of the ring finger over the PIP joint.  This was carried into subcutaneous tissues by spreading technique.  Gross purulence was encountered.  There was greenish discoloration to some of the purulence as well.  There was some devitalized tissue.  This was removed with the pickups and rongeurs lightly.  There was a bogginess underneath the extensor tendon.  An interval between the central slip and lateral band was made at the radial side.  The PIP joint was entered.  There was clear fluid within.  No gross purulence.  The wound and PIP joint were  copiously irrigated with sterile saline.  A piece of iodoform packing was placed underneath the tendon and the wound packed with quarter-inch iodoform gauze.  A digital block was performed with 10 mL of 0.25% plain Marcaine to aid in postoperative analgesia.  Cultures had been taken and sent to Micro for examination.  The wound was dressed with sterile 4x4s and wrapped with a Coban dressing lightly.  Alumafoam splint was placed and wrapped lightly with Coban dressing.  Tourniquet was deflated at 18 minutes.  Fingertips were pink with brisk capillary refill after deflation of tourniquet.  The operative drapes were  broken down, the patient was awakened from anesthesia safely.  He was transferred back to stretcher and taken to PACU in stable condition.  I will see him back in the office in 3 to 4 days for postoperative followup.  I will give him a prescription for Norco 5/325 one to two p.o. q.6 hours p.r.n. pain, dispensed #20 and Bactrim DS one p.o. b.i.d. x7 days.     Betha LoaKevin Indonesia Mckeough, MD     KK/MEDQ  D:  09/23/2016  T:  09/23/2016  Job:  [id number]

## 2016-09-23 NOTE — Op Note (Signed)
[id number] 

## 2016-09-23 NOTE — Brief Op Note (Signed)
09/22/2016 - 09/23/2016  2:58 AM  PATIENT:  Michael Black  31 y.o. male  PRE-OPERATIVE DIAGNOSIS:  infected right ring finger  POST-OPERATIVE DIAGNOSIS:  infected right ring finger  PROCEDURE:  Incision and drainage right ring finger abscess  SURGEON:  Surgeon(s) and Role:    Betha Loa* Yurani Fettes, MD - Primary  PHYSICIAN ASSISTANT:   ASSISTANTS: none   ANESTHESIA:   general  EBL:  Total I/O In: 500 [I.V.:500] Out: 5 [Blood:5]  BLOOD ADMINISTERED:none  DRAINS: iodoform packing  LOCAL MEDICATIONS USED:  MARCAINE     SPECIMEN:  Source of Specimen:  right ring finger  DISPOSITION OF SPECIMEN:  micro  COUNTS:  YES  TOURNIQUET:  Right arm: 18 minutes at 250 mmHg  DICTATION: .Other Dictation: Dictation Number [id number]  PLAN OF CARE: Discharge to home after PACU  PATIENT DISPOSITION:  PACU - hemodynamically stable.

## 2016-09-23 NOTE — Transfer of Care (Signed)
Immediate Anesthesia Transfer of Care Note  Patient: Michael SavoyJason Black  Procedure(s) Performed: Procedure(s): INCISION AND DRAINAGE RIGHT LONG FINGER  ABSCESS (Right)  Patient Location: PACU  Anesthesia Type:General  Level of Consciousness: awake  Airway & Oxygen Therapy: Patient Spontanous Breathing  Post-op Assessment: Report given to RN and Post -op Vital signs reviewed and stable  Post vital signs: Reviewed and stable  Last Vitals:  Vitals:   09/22/16 1933 09/23/16 0154  BP: (!) 124/95 133/82  Pulse: 86 80  Resp: (!) 22 20  Temp: 36.6 C     Last Pain:  Vitals:   09/22/16 1933  TempSrc: Oral  PainSc:          Complications: No apparent anesthesia complications

## 2016-09-23 NOTE — ED Notes (Signed)
Hand surgeon at bedside. 

## 2016-09-26 ENCOUNTER — Encounter (HOSPITAL_COMMUNITY): Payer: Self-pay | Admitting: Orthopedic Surgery

## 2016-09-26 NOTE — Anesthesia Postprocedure Evaluation (Signed)
Anesthesia Post Note  Patient: Michael Black  Procedure(s) Performed: Procedure(s) (LRB): INCISION AND DRAINAGE RIGHT RING FINGER  ABSCESS (Right)     Patient location during evaluation: PACU Anesthesia Type: General Level of consciousness: awake and alert Pain management: pain level controlled Vital Signs Assessment: post-procedure vital signs reviewed and stable Respiratory status: spontaneous breathing, nonlabored ventilation, respiratory function stable and patient connected to nasal cannula oxygen Cardiovascular status: blood pressure returned to baseline and stable Postop Assessment: no signs of nausea or vomiting Anesthetic complications: no    Last Vitals:  Vitals:   09/23/16 0315 09/23/16 0330  BP: (!) 143/92 (!) 135/91  Pulse: 73 61  Resp: 15 16  Temp:  36.5 C    Last Pain:  Vitals:   09/22/16 1933  TempSrc: Oral  PainSc:                  Shia Eber

## 2016-09-28 LAB — AEROBIC/ANAEROBIC CULTURE W GRAM STAIN (SURGICAL/DEEP WOUND)

## 2016-09-28 LAB — AEROBIC/ANAEROBIC CULTURE (SURGICAL/DEEP WOUND)

## 2018-10-23 IMAGING — DX DG FINGER RING 2+V*R*
3 series · 3 of 3 positions shown · non-contrast
Comparison: None.

CLINICAL DATA: Fourth finger wound for 5 days, now with erythema
and swelling spreading onto the hand.

EXAM:
RIGHT RING FINGER 2+V

[finger ap]
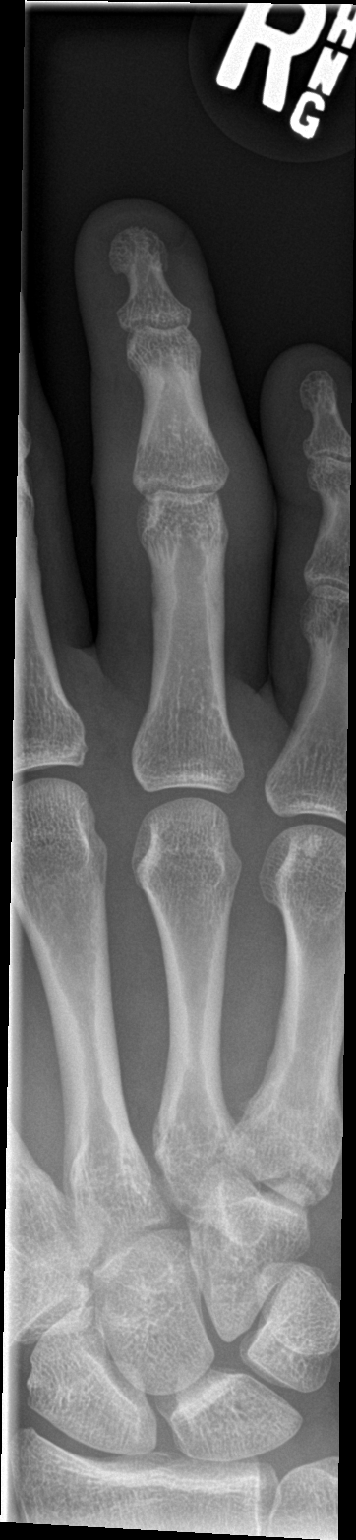

[finger obl]
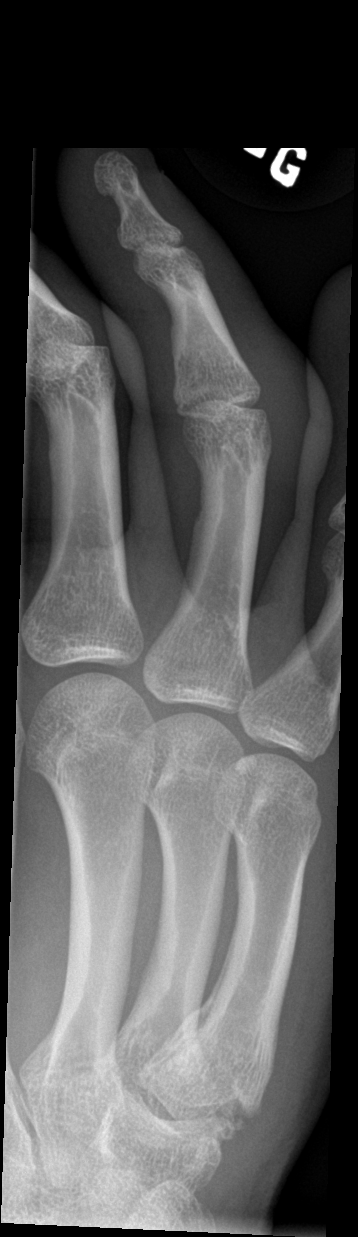

[finger lat]
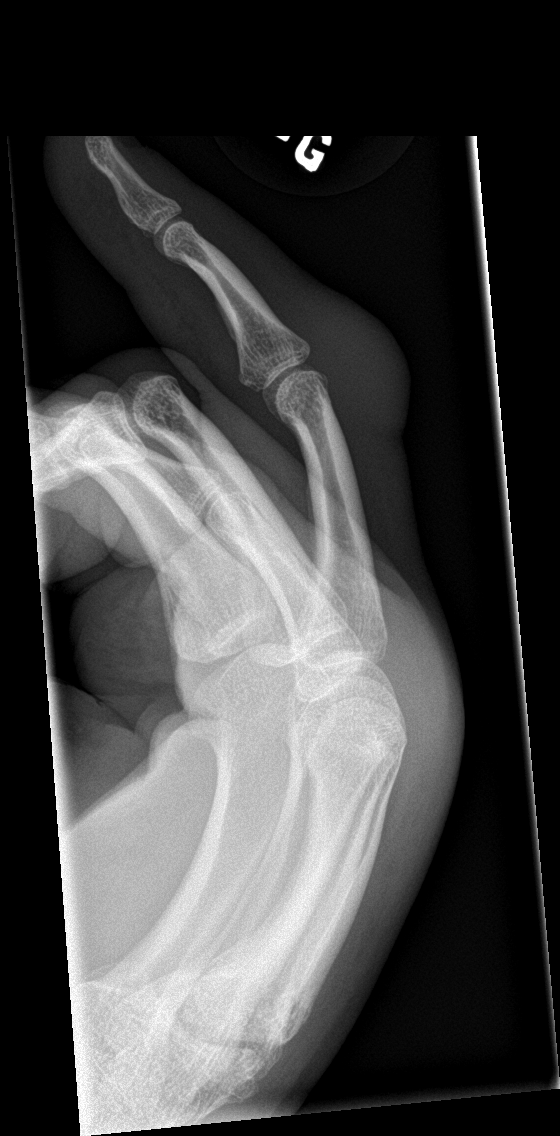

[3 of 3 positions shown; findings below may reference images not displayed]

FINDINGS: Negative for fracture or dislocation. Marked soft tissue swelling at
the dorsum of the fourth PIP. There also is soft tissue swelling at
the dorsum of the hand. No soft tissue gas. No radiopaque foreign
body.
IMPRESSION: Marked soft tissue swelling. No fracture, dislocation or radiopaque
foreign body.
